# Patient Record
Sex: Male | Born: 1980 | Race: White | Hispanic: No | Smoking: Former smoker
Health system: Southern US, Community
[De-identification: ages and names within clinical notes are randomized; demographics above are authoritative.]

## PROBLEM LIST (undated history)

## (undated) DIAGNOSIS — I639 Cerebral infarction, unspecified: Secondary | ICD-10-CM

## (undated) DIAGNOSIS — M6289 Other specified disorders of muscle: Secondary | ICD-10-CM

## (undated) DIAGNOSIS — Z9289 Personal history of other medical treatment: Secondary | ICD-10-CM

## (undated) DIAGNOSIS — R569 Unspecified convulsions: Secondary | ICD-10-CM

## (undated) HISTORY — DX: Other specified disorders of muscle: M62.89

## (undated) HISTORY — DX: Personal history of other medical treatment: Z92.89

## (undated) HISTORY — PX: HERNIA REPAIR: SHX51

---
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
## 2022-04-23 ENCOUNTER — Emergency Department (HOSPITAL_COMMUNITY): Payer: Medicaid Other

## 2022-04-23 ENCOUNTER — Other Ambulatory Visit: Payer: Self-pay

## 2022-04-23 ENCOUNTER — Emergency Department (HOSPITAL_COMMUNITY)
Admission: EM | Admit: 2022-04-23 | Discharge: 2022-04-23 | Disposition: A | Discharge status: Home / Self Care | Payer: Medicaid Other | Attending: Emergency Medicine | Admitting: Emergency Medicine

## 2022-04-23 ENCOUNTER — Encounter (HOSPITAL_COMMUNITY): Payer: Self-pay | Admitting: *Deleted

## 2022-04-23 DIAGNOSIS — Z982 Presence of cerebrospinal fluid drainage device: Secondary | ICD-10-CM | POA: Diagnosis not present

## 2022-04-23 DIAGNOSIS — G919 Hydrocephalus, unspecified: Secondary | ICD-10-CM | POA: Diagnosis not present

## 2022-04-23 DIAGNOSIS — H538 Other visual disturbances: Secondary | ICD-10-CM | POA: Diagnosis present

## 2022-04-23 HISTORY — DX: Cerebral infarction, unspecified: I63.9

## 2022-04-23 HISTORY — DX: Unspecified convulsions: R56.9

## 2022-04-23 NOTE — Discharge Instructions (Addendum)
I have spoken with the neurosurgery team tonight, they agreed to see you in this coming week.  According to the CT scan of your brain you have a piece of tubing that is not connected to the rest of the tubing that is on the wrong side of your brain, it may very well have been there for quite some time however I would like for you to see Dr. Wynetta Emery this week but I want you to come back to the emergency department if you develop severe or worsening pain, vomiting, fever, changes in your mental status or any other worsening symptoms. ? ?If you would like to see the neurosurgeon that you saw in [place] you are more than welcome to do that however we have excellent neurosurgeons here locally and the team that I spoke with this evening is highly qualified to take care of you locally. ? ?I have printed off the CT scan report, please take this with you to any doctors that you see this week ?

## 2022-04-23 NOTE — ED Provider Notes (Signed)
?[facility] ?Provider Note ? ? ?CSN: [id number] ?Arrival date & time: 04/23/22  2056 ? ?  ? ?History ? ?No chief complaint on file. ? ? ?Christopher Bowman is a 41 y.o. male. ? ?HPI ? 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  He was in his usual state of health doing well until about the last week when he started to feel like the right side of his head was inflamed where the shunt inserts.  He denies any swelling, denies vomiting, denies frank headaches but states that has occasional bit of blurry vision which is very transient, he has not had any significant headaches, no fevers, no stiff neck, no chest pain shortness of breath or abdominal pain.  He has been able to eat a normal amount of food but wanted to get checked out because he felt like something was not quite right. ? ?Home Medications ?Prior to Admission medications   ?Not on File  ?   ? ?Allergies    ?Penicillins   ? ?Review of Systems   ?Review of Systems  ?All other systems reviewed and are negative. ? ?Physical Exam ?Updated Vital Signs ?BP 128/80   Pulse 70   Temp 98.4 ?F (36.9 ?C) (Oral)   Resp 20   Ht 1.778 m (5\' 10" )   Wt 81.6 kg   SpO2 98%   BMI 25.83 kg/m?  ?Physical Exam ?Vitals and nursing note reviewed.  ?Constitutional:   ?   General: He is not in acute distress. ?   Appearance: He is well-developed.  ?HENT:  ?   Head: Normocephalic and atraumatic.  ?   Mouth/Throat:  ?   Pharynx: No oropharyngeal exudate.  ?Eyes:  ?   General: No scleral icterus.    ?   Right eye: No discharge.     ?   Left eye: No discharge.  ?   Conjunctiva/sclera: Conjunctivae normal.  ?   Pupils: Pupils are equal, round, and reactive to light.  ?Neck:  ?   Thyroid:  No thyromegaly.  ?   Vascular: No JVD.  ?   Comments: There is an area to the right lateral neck where the catheter is not palpated, possible discontinuity ?Cardiovascular:  ?   Rate and Rhythm: Normal rate and regular rhythm.  ?   Heart sounds: Normal heart sounds. No murmur heard. ?  No friction rub. No gallop.  ?Pulmonary:  ?   Effort: Pulmonary effort is normal. No respiratory distress.  ?   Breath sounds: Normal breath sounds. No wheezing or rales.  ?Abdominal:  ?   General: Bowel sounds are normal. There is no distension.  ?   Palpations: Abdomen is soft. There is no mass.  ?   Tenderness: There is no abdominal tenderness.  ?Musculoskeletal:     ?   General: No tenderness. Normal range of motion.  ?   Cervical back: Normal range of motion and neck supple.  ?Lymphadenopathy:  ?   Cervical: No cervical adenopathy.  ?Skin: ?   General: Skin is warm and dry.  ?   Findings: No erythema or rash.  ?Neurological:  ?   Mental Status: He is alert.  ?  Coordination: Coordination normal.  ?   Comments: Right arm with some flexion contractures, able to follow commands with all other extremities, cranial nerves III through XII appear otherwise normal except for slight facial droop  ?Psychiatric:     ?   Behavior: Behavior normal.  ? ? ?ED Results / Procedures / Treatments   ?Labs ?(all labs ordered are listed, but only abnormal results are displayed) ?Labs Reviewed - No data to display ? ?EKG ?None ? ?Radiology ?DG Chest 1 View ? ?Result Date: 04/23/2022 ?CLINICAL DATA:  Evaluate ventricular shunt catheter EXAM: PORTABLE CHEST 1 VIEW COMPARISON:  None. FINDINGS: Cardiac shadow is within normal limits. The lungs are clear. No bony abnormality is noted. Right-sided shunt catheter is noted. IMPRESSION: No acute abnormality noted. Electronically Signed   By: Alcide Clever M.D.   On: 04/23/2022 21:42  ? ?DG Cervical Spine 1 View ? ?Result Date: 04/23/2022 ?CLINICAL DATA:  Shunt series EXAM: DG CERVICAL SPINE - 1 VIEW COMPARISON:   By report from 01/18/2019 FINDINGS: No acute bony abnormality is noted. There is discontinuity of the shunt catheter in the right neck although by report from prior chest x-ray in 2020 these changes are stable. The superior most aspect of the catheter is not well visualized. No other focal abnormality is noted. IMPRESSION: Discontinuity of the right-sided ventricular shunt catheter although by history, this is stable from 2020. Electronically Signed   By: Alcide Clever M.D.   On: 04/23/2022 21:41  ? ?DG Abd 1 View ? ?Result Date: 04/23/2022 ?CLINICAL DATA:  Evaluate shunt catheter EXAM: ABDOMEN - 1 VIEW COMPARISON:  None. FINDINGS: Scattered large and small bowel gas is noted. Shunt catheter is noted in the right abdomen. No acute bony abnormality is noted. IMPRESSION: Shunt catheter in the right abdomen.  No acute abnormality is noted. Electronically Signed   By: Alcide Clever M.D.   On: 04/23/2022 21:43  ? ?CT Head Wo Contrast ? ?Result Date: 04/23/2022 ?CLINICAL DATA:  Headache, worsening around the shunt on the right side. EXAM: CT HEAD WITHOUT CONTRAST TECHNIQUE: Contiguous axial images were obtained from the base of the skull through the vertex without intravenous contrast. RADIATION DOSE REDUCTION: This exam was performed according to the departmental dose-optimization program which includes automated exposure control, adjustment of the mA and/or kV according to patient size and/or use of iterative reconstruction technique. COMPARISON:  None. FINDINGS: Brain: Shunt catheter tubing fragment and spring in the posterior left temporal lobe, extending into the left lateral ventricle. Given that the only burr hole, which demonstrates additional catheter fragment is in the right parietal calvarium, this piece tubing has likely migrated from its original location. Additional small possible piece of shunt tubing is noted along the left frontal lobe (series 3, image 52) and along the right anterior falx (series 3, image  51). Enlargement of the left-greater-than-right lateral ventricle, with mild enlargement of the third ventricle and normal fourth ventricle. No definite periventricular hypodensity to suggest transependymal flow of CSF. 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 No acute hemorrhage, mass, or midline shift. No extra-axial collection. Vascular: No hyperdense vessel. Skull: Right parietal burr hole.  No acute fracture or focal lesion. Sinuses/Orbits: Clear paranasal sinuses. No acute finding in the orbits. The mastoids are well aerated. Other: Contiguous with the right parietal burr hole fragment, there is additional shunt hardware in tubing in the soft tissues of the right neck. IMPRESSION: 1. Nonfunctional shunt catheter tubing fragment with spring  in the posterior left temporal lobe, extending into the left lateral ventricle, which appears to have originated from a right parietal burr hole and has migrated significantly from its original location. Additional catheter fragments are noted along the right anterior falx and left frontal convexity. 2. Enlargement of the lateral ventricles, with less significant enlargement of the third ventricle, some of which is attributable to cerebral volume loss in the left frontal lobe from perinatal insult but some of which may atrial to hydrocephalus. No definite hypodensity to suggest transependymal flow of CSF. Correlation with prior imaging would be very helpful, if available. These results were called by telephone at the time of interpretation on 04/23/2022 at 10:57 pm to provider [facility] , who verbally acknowledged these results. Electronically Signed   By: Wiliam Ke M.D.   On: 04/23/2022 23:01   ? ?Procedures ?Procedures  ? ? ?Medications Ordered in ED ?Medications - No data to display ? ?ED Course/ Medical Decision Making/ A&P ?  ?                        ?Medical Decision Making ?Amount and/or Complexity of  Data Reviewed ?Radiology: ordered. ? ? ?I am able to palpate the entire length of the VP shunt from his right scalp down his neck and over the clavicle into the chest wall.  There is no obvious breakage of the

## 2022-04-23 NOTE — ED Notes (Signed)
ED Provider at bedside. 

## 2022-04-23 NOTE — ED Notes (Signed)
Pt returned from CT Scan 

## 2022-04-23 NOTE — ED Triage Notes (Signed)
Pt states he is able to feel irritation from shunt in his head for the past week.  ?

## 2022-08-03 ENCOUNTER — Other Ambulatory Visit: Payer: Self-pay | Admitting: Neurosurgery

## 2022-08-03 ENCOUNTER — Other Ambulatory Visit (HOSPITAL_COMMUNITY): Payer: Self-pay | Admitting: Neurosurgery

## 2022-08-03 DIAGNOSIS — G911 Obstructive hydrocephalus: Secondary | ICD-10-CM

## 2022-08-29 ENCOUNTER — Ambulatory Visit (HOSPITAL_COMMUNITY)
Admission: RE | Admit: 2022-08-29 | Discharge: 2022-08-29 | Disposition: A | Discharge status: Home / Self Care | Payer: Medicaid Other | Source: Ambulatory Visit | Attending: Neurosurgery | Admitting: Neurosurgery

## 2022-08-29 ENCOUNTER — Encounter (HOSPITAL_COMMUNITY): Payer: Self-pay

## 2022-08-29 DIAGNOSIS — G911 Obstructive hydrocephalus: Secondary | ICD-10-CM

## 2022-08-29 NOTE — Progress Notes (Signed)
Patient arrived for outpatient MRI. He has an implanted brain shunt from around 40 years ago. He has no implant card and doesn't know where it was implanted. Spoke with the neuroradiologist, Dr. Mosetta Putt, and he reviewed the CT Head images. He stated the shunt is in pieces and we should not scan him due to safety concerns.

## 2023-04-21 IMAGING — CT CT HEAD W/O CM
4 series · 15 of 47 positions shown, 17 images · non-contrast
Comparison: None.

CLINICAL DATA: Headache, worsening around the shunt on the right
side.



[Series 2: head w o · axial · 0.46mm/px · z∈[+20,+140]mm · 7 of 33 slices shown, 9 images]
[im 5/33  brain]
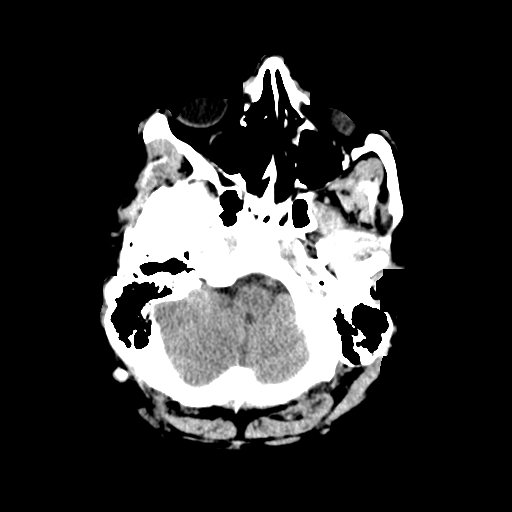
[im 5/33  bone]
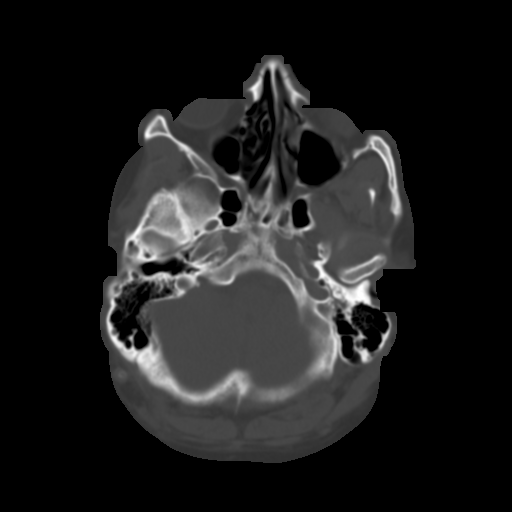
[im 9/33  brain]
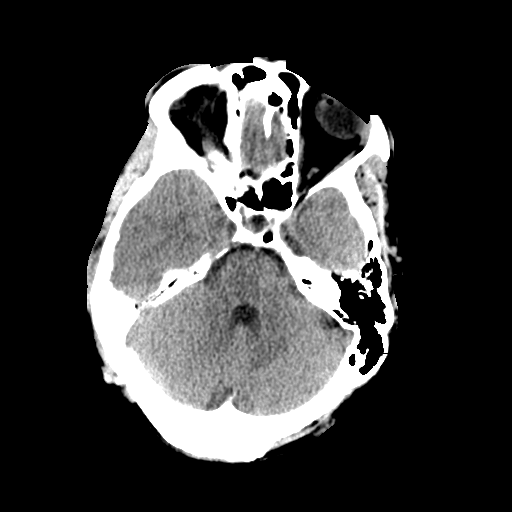
[im 13/33  brain]
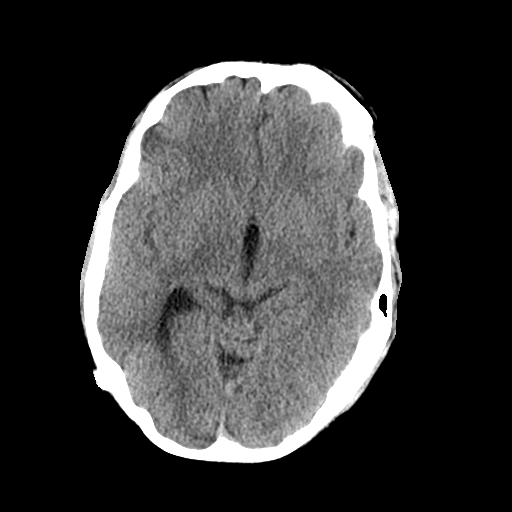
[im 17/33  brain]
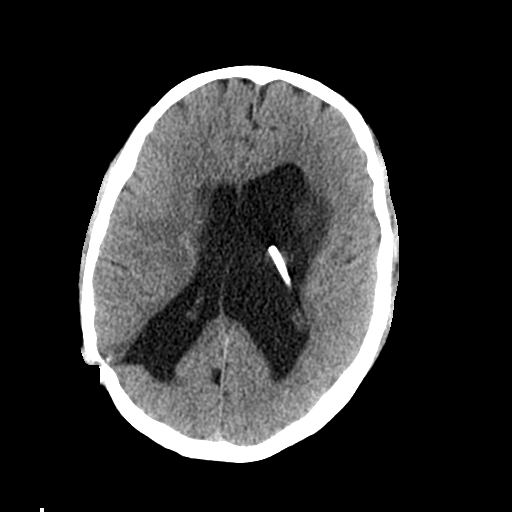
[im 21/33  brain]
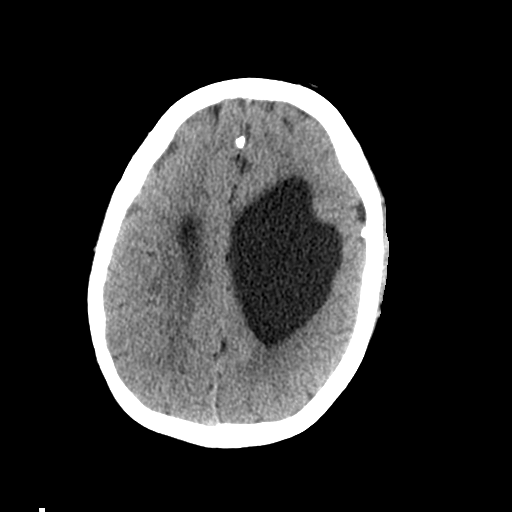
[im 21/33  bone]
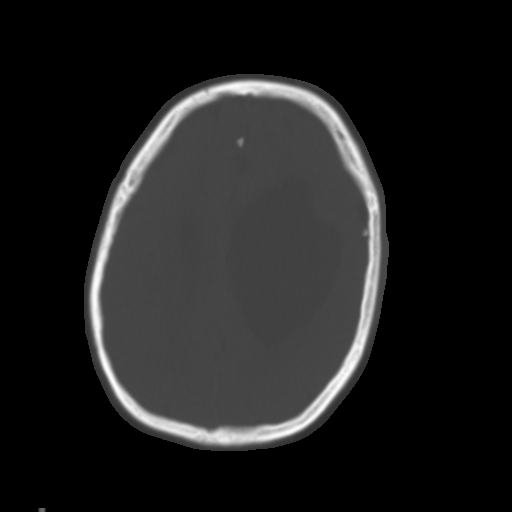
[im 25/33  brain]
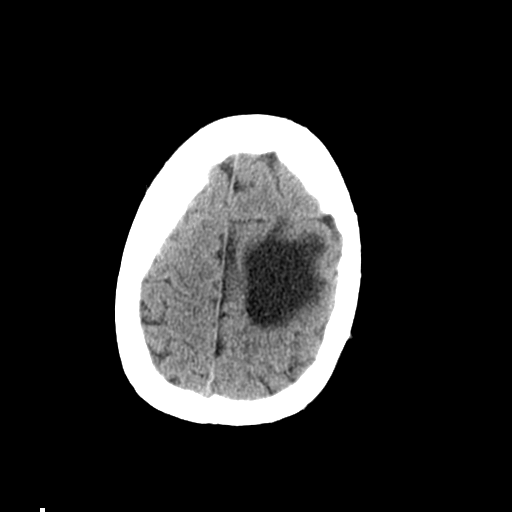
[im 29/33  brain]
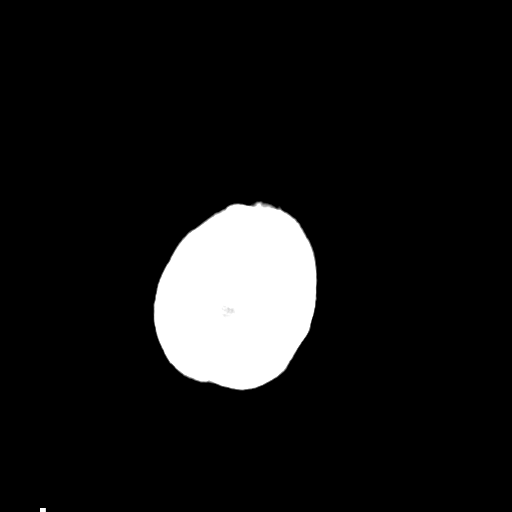

[Series 3: head bone · axial · 0.46mm/px · z∈[+16,+32]mm · 2 of 81 slices shown]
[im 9/81  bone]
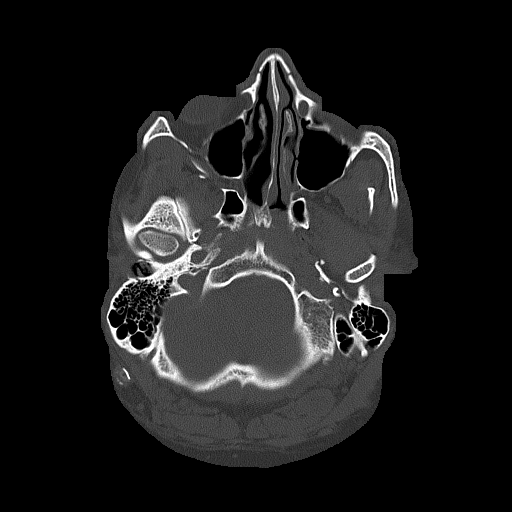
[im 17/81  bone]
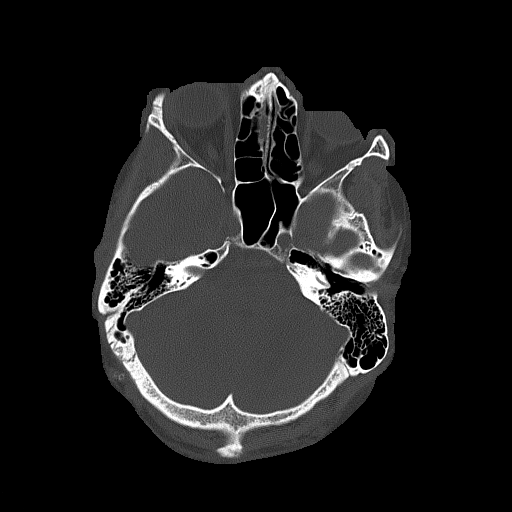

[Series 4: coronal soft · coronal · 0.36mm/px · 3 of 75 slices shown]
[im 25/75  brain]
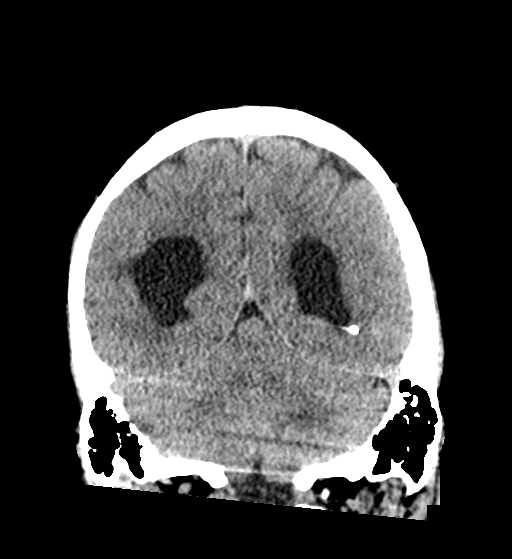
[im 33/75  brain]
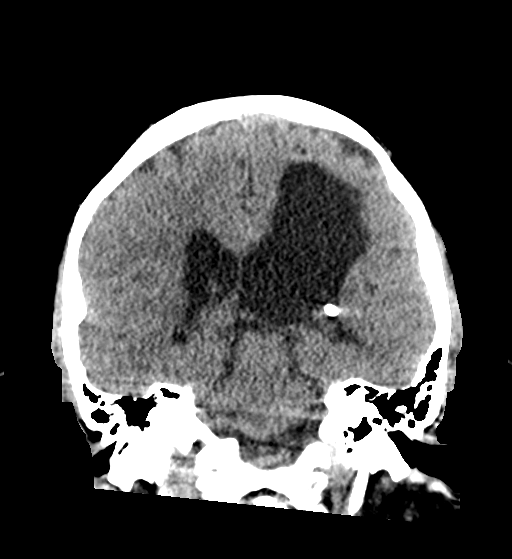
[im 42/75  brain]
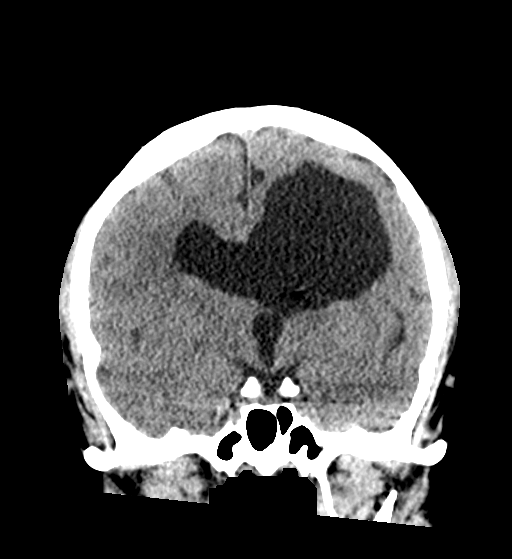

[Series 5: sagittal soft · sagittal · 0.36mm/px · 3 of 65 slices shown]
[im 22/65  brain]
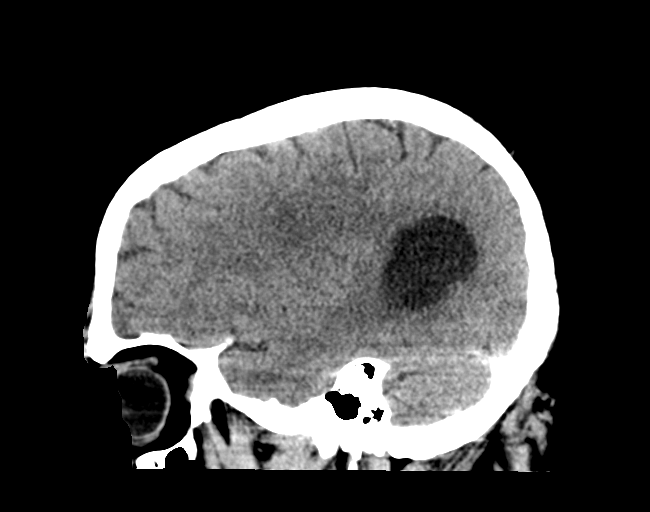
[im 33/65  brain]
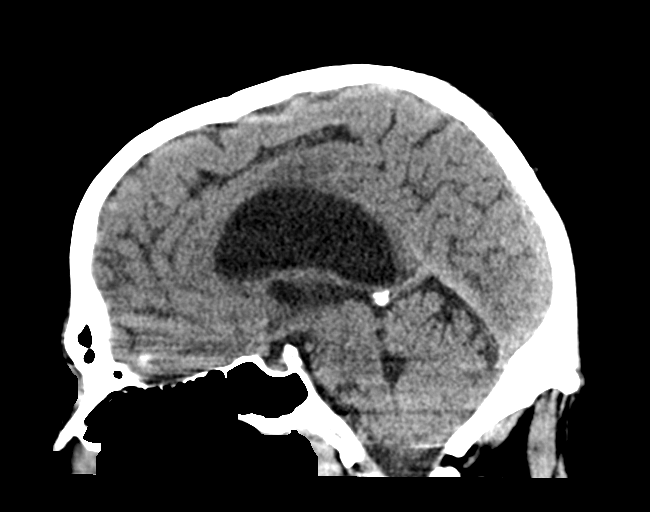
[im 43/65  brain]
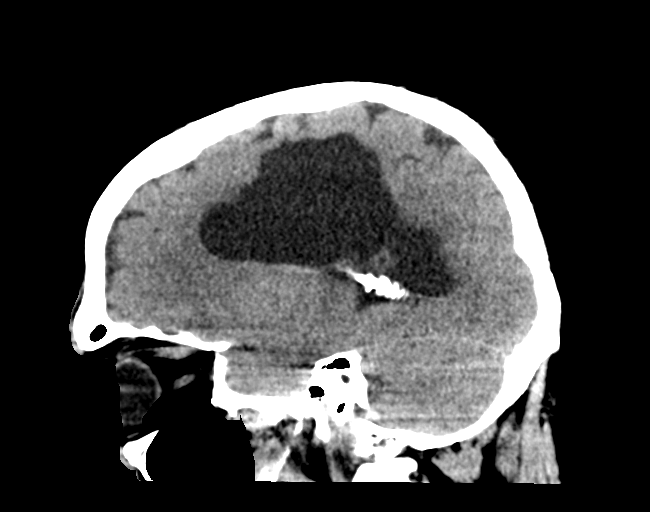

[15 of 47 positions shown; findings below may reference images not displayed]

FINDINGS: Brain: Shunt catheter tubing fragment and De Lara in the posterior
left temporal lobe, extending into the left lateral ventricle. Given
that the only burr hole, which demonstrates additional catheter
fragment is in the right parietal calvarium, this piece tubing has
likely migrated from its original location. Additional small
possible piece of shunt tubing is noted along the left frontal lobe
(series 3, image 52) and along the right anterior falx (series 3,
image 51).

Enlargement of the left-greater-than-right lateral ventricle, with
mild enlargement of the third ventricle and normal fourth ventricle.
No definite periventricular hypodensity to suggest transependymal
flow of CSF. Cerebral volume loss in the left frontal lobe,
compatible with patient's reported perinatal infarct.

No acute hemorrhage, mass, or midline shift. No extra-axial
collection.

Vascular: No hyperdense vessel.

Skull: Right parietal burr hole.  No acute fracture or focal lesion.

Sinuses/Orbits: Clear paranasal sinuses. No acute finding in the
orbits. The mastoids are well aerated.

Other: Contiguous with the right parietal burr hole fragment, there
is additional shunt hardware in tubing in the soft tissues of the
right neck.
IMPRESSION: 1. Nonfunctional shunt catheter tubing fragment with De Lara in the
posterior left temporal lobe, extending into the left lateral
ventricle, which appears to have originated from a right parietal
burr hole and has migrated significantly from its original location.
Additional catheter fragments are noted along the right anterior
falx and left frontal convexity.
2. Enlargement of the lateral ventricles, with less significant
enlargement of the third ventricle, some of which is attributable to
cerebral volume loss in the left frontal lobe from perinatal insult
but some of which may atrial to hydrocephalus. No definite
hypodensity to suggest transependymal flow of CSF. Correlation with
prior imaging would be very helpful, if available.

These results were called by telephone at the time of interpretation
acknowledged these results.

## 2023-05-13 ENCOUNTER — Ambulatory Visit: Payer: Medicaid Other | Admitting: Family Medicine

## 2023-05-13 ENCOUNTER — Encounter: Payer: Self-pay | Admitting: Family Medicine

## 2023-05-13 VITALS — BP 121/80 | HR 71 | Ht 70.0 in | Wt 218.0 lb

## 2023-05-13 DIAGNOSIS — D171 Benign lipomatous neoplasm of skin and subcutaneous tissue of trunk: Secondary | ICD-10-CM

## 2023-05-13 DIAGNOSIS — Z114 Encounter for screening for human immunodeficiency virus [HIV]: Secondary | ICD-10-CM

## 2023-05-13 DIAGNOSIS — I10 Essential (primary) hypertension: Secondary | ICD-10-CM | POA: Diagnosis not present

## 2023-05-13 DIAGNOSIS — Z8709 Personal history of other diseases of the respiratory system: Secondary | ICD-10-CM

## 2023-05-13 DIAGNOSIS — Z1159 Encounter for screening for other viral diseases: Secondary | ICD-10-CM

## 2023-05-13 DIAGNOSIS — E559 Vitamin D deficiency, unspecified: Secondary | ICD-10-CM

## 2023-05-13 DIAGNOSIS — Z131 Encounter for screening for diabetes mellitus: Secondary | ICD-10-CM

## 2023-05-13 DIAGNOSIS — Z13220 Encounter for screening for lipoid disorders: Secondary | ICD-10-CM

## 2023-05-13 DIAGNOSIS — Z9289 Personal history of other medical treatment: Secondary | ICD-10-CM | POA: Diagnosis not present

## 2023-05-13 DIAGNOSIS — Z1329 Encounter for screening for other suspected endocrine disorder: Secondary | ICD-10-CM

## 2023-05-13 DIAGNOSIS — Z23 Encounter for immunization: Secondary | ICD-10-CM | POA: Diagnosis not present

## 2023-05-13 NOTE — Assessment & Plan Note (Signed)
Non painful small fatty lump noted on Left upper quadrant  Discussed lifestyle modifications follow diet low in saturated fat, reduce dietary salt intake, avoid fatty foods, maintain an exercise routine 3 to 5 days a week for a minimum total of 150 minutes.

## 2023-05-13 NOTE — Progress Notes (Signed)
New Patient Office Visit   Subjective   Patient ID: Christopher Bowman, male    DOB: 12-31-1980  Age: 42 y.o. MRN: [id number]  CC:  Chief Complaint  Patient presents with   Establish Care    HPI Christopher Bowman 42 year old male, presents to establish care. He  has a past medical history of Hernia of fascia, History of creation of ventriculoperitoneal shunt, Seizures (HCC), and Stroke (HCC).For the details of today's visit, please refer to assessment and plan.   HPI    Outpatient Encounter Medications as of 05/13/2023  Medication Sig   carbamazepine (TEGRETOL XR) 200 MG 12 hr tablet Take 200 mg by mouth 2 (two) times daily.   No facility-administered encounter medications on file as of 05/13/2023.    Past Surgical History:  Procedure Laterality Date   HERNIA REPAIR     SHUNT REVISION  1984    Review of Systems  Constitutional:  Negative for chills and fever.  Eyes:  Negative for blurred vision.  Respiratory:  Negative for shortness of breath.   Cardiovascular:  Negative for chest pain.  Gastrointestinal:  Negative for abdominal pain and vomiting.  Genitourinary:  Negative for dysuria.  Musculoskeletal:  Negative for myalgias.  Skin:  Negative for rash.  Neurological:  Positive for headaches. Negative for dizziness.      Objective    BP 121/80   Pulse 71   Ht 5\' 10"  (1.778 m)   Wt 218 lb (98.9 kg)   SpO2 98%   BMI 31.28 kg/m   Physical Exam Vitals reviewed.  Constitutional:      General: He is not in acute distress.    Appearance: Normal appearance. He is not ill-appearing, toxic-appearing or diaphoretic.  HENT:     Head: Normocephalic.  Eyes:     General:        Right eye: No discharge.        Left eye: No discharge.     Conjunctiva/sclera: Conjunctivae normal.  Cardiovascular:     Rate and Rhythm: Normal rate.     Pulses: Normal pulses.     Heart sounds: Normal heart sounds.  Pulmonary:     Effort: Pulmonary effort is normal. No respiratory distress.      Breath sounds: Normal breath sounds.  Abdominal:     General: Bowel sounds are normal.     Palpations: Abdomen is soft.     Tenderness: There is no abdominal tenderness. There is no guarding.  Musculoskeletal:     Cervical back: Normal range of motion.  Skin:    General: Skin is warm and dry.     Capillary Refill: Capillary refill takes less than 2 seconds.  Neurological:     General: No focal deficit present.     Mental Status: He is alert and oriented to person, place, and time.     Coordination: Coordination normal.     Gait: Gait normal.  Psychiatric:        Mood and Affect: Mood normal.        Behavior: Behavior normal.       Assessment & Plan:  Vitamin D deficiency -     VITAMIN D 25 Hydroxy (Vit-D Deficiency, Fractures)  Primary hypertension -     CBC with Differential/Platelet -     CMP14+EGFR -     Microalbumin / creatinine urine ratio  Screening for HIV (human immunodeficiency virus) -     HIV Antibody (routine testing w rflx)  Need for hepatitis  C screening test -     Hepatitis C antibody  Screening for thyroid disorder -     TSH + free T4  Screening for diabetes mellitus -     Hemoglobin A1c  Screening for lipid disorders -     Lipid panel  History of nasal obstruction -     Ambulatory referral to ENT  History of creation of ventriculoperitoneal shunt Assessment & Plan: Referral placed to Neurosurgery  Last CT Showed "patient has a separated piece of tubing that is now on the left side of the brain, it is supposed to be in the right side and is no longer connected to the tubing that exits the cranium."  Advise patient if worsening headache or any new alarming symptoms visit ED  Orders: -     Ambulatory referral to Neurosurgery  Abdominal lipoma Assessment & Plan: Non painful small fatty lump noted on Left upper quadrant  Discussed lifestyle modifications follow diet low in saturated fat, reduce dietary salt intake, avoid fatty foods, maintain  an exercise routine 3 to 5 days a week for a minimum total of 150 minutes.     Other orders -     Tdap vaccine greater than or equal to 7yo IM    Return in about 4 months (around 09/13/2023), or if symptoms worsen or fail to improve, for chronic follow-up.   Cruzita Lederer Newman Nip, FNP

## 2023-05-13 NOTE — Assessment & Plan Note (Addendum)
Referral placed to Neurosurgery  Last CT Showed "patient has a separated piece of tubing that is now on the left side of the brain, it is supposed to be in the right side and is no longer connected to the tubing that exits the cranium."  Advise patient if worsening headache or any new alarming symptoms visit ED

## 2023-05-13 NOTE — Patient Instructions (Signed)

## 2023-05-15 LAB — CBC WITH DIFFERENTIAL/PLATELET
Basophils Absolute: 0.1 10*3/uL (ref 0.0–0.2)
Basos: 1 %
EOS (ABSOLUTE): 0.3 10*3/uL (ref 0.0–0.4)
Eos: 4 %
Hematocrit: 49.9 % (ref 37.5–51.0)
Hemoglobin: 16.7 g/dL (ref 13.0–17.7)
Immature Grans (Abs): 0 10*3/uL (ref 0.0–0.1)
Immature Granulocytes: 0 %
Lymphocytes Absolute: 3 10*3/uL (ref 0.7–3.1)
Lymphs: 31 %
MCH: 29.6 pg (ref 26.6–33.0)
MCHC: 33.5 g/dL (ref 31.5–35.7)
MCV: 89 fL (ref 79–97)
Monocytes Absolute: 0.6 10*3/uL (ref 0.1–0.9)
Monocytes: 6 %
Neutrophils Absolute: 5.8 10*3/uL (ref 1.4–7.0)
Neutrophils: 58 %
Platelets: 357 10*3/uL (ref 150–450)
RBC: 5.64 x10E6/uL (ref 4.14–5.80)
RDW: 13.1 % (ref 11.6–15.4)
WBC: 9.8 10*3/uL (ref 3.4–10.8)

## 2023-05-15 LAB — CMP14+EGFR
ALT: 66 IU/L — ABNORMAL HIGH (ref 0–44)
AST: 37 IU/L (ref 0–40)
Albumin/Globulin Ratio: 2.2 (ref 1.2–2.2)
Albumin: 5.2 g/dL — ABNORMAL HIGH (ref 4.1–5.1)
Alkaline Phosphatase: 134 IU/L — ABNORMAL HIGH (ref 44–121)
BUN/Creatinine Ratio: 10 (ref 9–20)
BUN: 13 mg/dL (ref 6–24)
Bilirubin Total: 0.6 mg/dL (ref 0.0–1.2)
CO2: 21 mmol/L (ref 20–29)
Calcium: 10.2 mg/dL (ref 8.7–10.2)
Chloride: 98 mmol/L (ref 96–106)
Creatinine, Ser: 1.29 mg/dL — ABNORMAL HIGH (ref 0.76–1.27)
Globulin, Total: 2.4 g/dL (ref 1.5–4.5)
Glucose: 83 mg/dL (ref 70–99)
Potassium: 4.6 mmol/L (ref 3.5–5.2)
Sodium: 139 mmol/L (ref 134–144)
Total Protein: 7.6 g/dL (ref 6.0–8.5)
eGFR: 71 mL/min/{1.73_m2} (ref >59)

## 2023-05-15 LAB — TSH+FREE T4
Free T4: 1.12 ng/dL (ref 0.82–1.77)
TSH: 3.53 u[IU]/mL (ref 0.450–4.500)

## 2023-05-15 LAB — HEMOGLOBIN A1C
Est. average glucose Bld gHb Est-mCnc: 108 mg/dL
Hgb A1c MFr Bld: 5.4 % (ref 4.8–5.6)

## 2023-05-15 LAB — LIPID PANEL
Chol/HDL Ratio: 6.4 ratio — ABNORMAL HIGH (ref 0.0–5.0)
Cholesterol, Total: 211 mg/dL — ABNORMAL HIGH (ref 100–199)
HDL: 33 mg/dL — ABNORMAL LOW (ref >39)
LDL Chol Calc (NIH): 145 mg/dL — ABNORMAL HIGH (ref 0–99)
Triglycerides: 182 mg/dL — ABNORMAL HIGH (ref 0–149)
VLDL Cholesterol Cal: 33 mg/dL (ref 5–40)

## 2023-05-15 LAB — HIV ANTIBODY (ROUTINE TESTING W REFLEX): HIV Screen 4th Generation wRfx: NONREACTIVE

## 2023-05-15 LAB — MICROALBUMIN / CREATININE URINE RATIO
Creatinine, Urine: 168 mg/dL
Microalb/Creat Ratio: 3 mg/g creat (ref 0–29)
Microalbumin, Urine: 4.7 ug/mL

## 2023-05-15 LAB — VITAMIN D 25 HYDROXY (VIT D DEFICIENCY, FRACTURES): Vit D, 25-Hydroxy: 19.6 ng/mL — ABNORMAL LOW (ref 30.0–100.0)

## 2023-05-15 LAB — HEPATITIS C ANTIBODY: Hep C Virus Ab: NONREACTIVE

## 2023-05-16 NOTE — Progress Notes (Signed)
Please inform patient,  Liver enzymes elevated possible due to signs of increased build up fat in the liver. I encourage weight loss, healthy eating habits- low carb high protein diet, avoid fatty foods, smoking and alcohol intake. Maintain an excercise routine to minimum of 150 minuties a week.     Cholesterol levels elevated, start lifestyle modifications follow diet low in saturated fat, reduce dietary salt intake, avoid fatty foods, maintain an exercise routine 3 to 5 days a week for a minimum total of 150 minutes. I encourage over the counter omega-3 fish oil 1000 mg twice daily.    Vitamin D levels low, I advise to taking  over the counter supplements of vitamin D [phone] IU/day to prevent low vitamin D levels. Consuming Vitamin D rich food sources include fish, salmon, sardines, egg yolks, red meat, liver, oranges, soy milk.

## 2023-06-01 NOTE — Progress Notes (Unsigned)
Referring Physician:  Rica Records, FNP [phone] [address] [place],  [place] [address]  Primary Physician:  Rica Records, FNP  History of Present Illness: 06/02/2023 [name] is here today with a chief complaint of  his shunt not working. A head CT was preformed and showed that the tubing had separated and was not on the correct side.   He had a shunt placed when he was a child which was last revised at age 42.  He comes in today with headaches.  These have been ongoing for some time.  He has known his tubing has been fractured for some time.  Per his report, it has been years.  She reports pain on the left side of his head.  Otherwise he is at his neurologic baseline.    Past Surgery:  Shunt placed when he was a child.    The symptoms are causing a significant impact on the patient's life.   I have utilized the care everywhere function in epic to review the outside records available from external health systems.  Review of Systems:  A 10 point review of systems is negative, except for the pertinent positives and negatives detailed in the HPI.  Past Medical History: Past Medical History:  Diagnosis Date   Hernia of fascia    History of creation of ventriculoperitoneal shunt    Seizures (HCC)    Stroke [facility])     Past Surgical History: Past Surgical History:  Procedure Laterality Date   HERNIA REPAIR     SHUNT REVISION  1984    Allergies: Allergies as of 06/02/2023 - Review Complete 06/02/2023  Allergen Reaction Noted   Penicillins  04/23/2022    Medications:  Current Outpatient Medications:    carbamazepine (TEGRETOL XR) 200 MG 12 hr tablet, Take 200 mg by mouth 2 (two) times daily., Disp: , Rfl:   Social History: Social History   Tobacco Use   Smoking status: Former    Types: Cigarettes   Smokeless tobacco: Never  Substance Use Topics   Alcohol use: Not Currently   Drug use: Yes    Types: Marijuana    Comment: occ.  use    Family Medical History: Family History  Problem Relation Age of Onset   Hepatitis C Mother    Cancer Maternal Uncle    Dementia Paternal Uncle    Cancer Paternal Grandfather     Physical Examination: Vitals:   06/02/23 1428  BP: 118/80    General: Patient is in no apparent distress. Attention to examination is appropriate.  Neck:   Supple.  Full range of motion.  Respiratory: Patient is breathing without any difficulty.   NEUROLOGICAL:     Awake, alert, oriented to person, place, and time.  Speech is clear and fluent.   Cranial Nerves: Pupils equal round and reactive to light.  Facial tone is symmetric.  Facial sensation is symmetric. Shoulder shrug is symmetric. Tongue protrusion is midline.    Strength: Side Biceps Triceps Deltoid Interossei Grip Wrist Ext. Wrist Flex.  R 4 4 4 2 3 4 4   L 5 5 5 5 5 5 5    Side Iliopsoas Quads Hamstring PF DF EHL  R 5 5 5 5 5 5   L 5 5 5 5 5 5    Bilateral upper and lower extremity sensation is intact to light touch.    No evidence of dysmetria noted.  Gait is normal.     Medical  Decision Making  Imaging: CT Head 04/23/2022 IMPRESSION: 1. Nonfunctional shunt catheter tubing fragment with spring in the posterior left temporal lobe, extending into the left lateral ventricle, which appears to have originated from a right parietal burr hole and has migrated significantly from its original location. Additional catheter fragments are noted along the right anterior falx and left frontal convexity. 2. Enlargement of the lateral ventricles, with less significant enlargement of the third ventricle, some of which is attributable to cerebral volume loss in the left frontal lobe from perinatal insult but some of which may atrial to hydrocephalus. No definite hypodensity to suggest transependymal flow of CSF. Correlation with prior imaging would be very helpful, if available.   These results were called by telephone at the time  of interpretation on 04/23/2022 at 10:57 pm to provider [facility] , who verbally acknowledged these results.     Electronically Signed   By: Wiliam Ke M.D.   On: 04/23/2022 23:01   I have personally reviewed the images and agree with the above interpretation.  Assessment and Plan: [name] is a pleasant 42 y.o. 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  He has a fractured intracranial catheter.  His tubing is also fractured in the neck.  I am not certain whether he has been shunt dependent.  I would like to get a low volume lumbar puncture to determine his current intracranial pressure.  If that is normal, it may make sense to observe this.  If it is elevated, I would recommend new shunt placed on the left side.  I did relay to him that I would be unable to remove the catheter currently in his lateral ventricle.  If he would like to consider that, I would refer him to [facility] to one of the surgeons who has access to an endoscope.  I would like to see him back after his lumbar puncture.   Thank you for involving me in the care of this patient.      Jakyria Bleau K. Myer Haff MD, [facility]

## 2023-06-02 ENCOUNTER — Ambulatory Visit: Payer: Medicaid Other | Admitting: Neurosurgery

## 2023-06-02 ENCOUNTER — Encounter: Payer: Self-pay | Admitting: Neurosurgery

## 2023-06-02 VITALS — BP 118/80 | Ht 70.0 in | Wt 221.0 lb

## 2023-06-02 DIAGNOSIS — G919 Hydrocephalus, unspecified: Secondary | ICD-10-CM

## 2023-06-02 DIAGNOSIS — Z9289 Personal history of other medical treatment: Secondary | ICD-10-CM

## 2023-06-08 ENCOUNTER — Telehealth: Payer: Self-pay

## 2023-06-08 NOTE — Telephone Encounter (Signed)
I have sent a message to Paulla Fore in speciality scheduling.   The patient doesn't need to hold any medications for this procedure. Marylu Lund has tried calling him but hasn't been able to reach him, she will continue to try.

## 2023-06-22 ENCOUNTER — Inpatient Hospital Stay
Admission: RE | Admit: 2023-06-22 | Discharge: 2023-06-22 | Disposition: A | Discharge status: Home / Self Care | Payer: Self-pay | Source: Ambulatory Visit | Attending: Neurosurgery | Admitting: Neurosurgery

## 2023-06-22 ENCOUNTER — Other Ambulatory Visit: Payer: Self-pay

## 2023-06-22 DIAGNOSIS — Z049 Encounter for examination and observation for unspecified reason: Secondary | ICD-10-CM

## 2023-09-16 ENCOUNTER — Ambulatory Visit: Payer: Medicaid Other | Admitting: Family Medicine

## 2023-09-29 ENCOUNTER — Ambulatory Visit: Payer: Medicaid Other | Admitting: Family Medicine

## 2023-10-06 ENCOUNTER — Encounter: Payer: Self-pay | Admitting: Family Medicine

## 2023-10-06 ENCOUNTER — Ambulatory Visit: Payer: Medicaid Other | Admitting: Family Medicine

## 2023-10-06 VITALS — BP 138/80 | HR 69 | Ht 70.0 in | Wt 226.0 lb

## 2023-10-06 DIAGNOSIS — Z136 Encounter for screening for cardiovascular disorders: Secondary | ICD-10-CM

## 2023-10-06 DIAGNOSIS — R300 Dysuria: Secondary | ICD-10-CM | POA: Diagnosis not present

## 2023-10-06 DIAGNOSIS — E782 Mixed hyperlipidemia: Secondary | ICD-10-CM | POA: Diagnosis not present

## 2023-10-06 DIAGNOSIS — E785 Hyperlipidemia, unspecified: Secondary | ICD-10-CM | POA: Insufficient documentation

## 2023-10-06 DIAGNOSIS — G911 Obstructive hydrocephalus: Secondary | ICD-10-CM | POA: Insufficient documentation

## 2023-10-06 MED ORDER — ALBUTEROL SULFATE HFA 108 (90 BASE) MCG/ACT IN AERS: 2.0000 | INHALATION_SPRAY | Freq: Four times a day (QID) | RESPIRATORY_TRACT | 2 refills | Status: AC | PRN

## 2023-10-06 NOTE — Progress Notes (Signed)
Patient Office Visit   Subjective   Patient ID: Christopher Bowman, male    DOB: 09-20-1981  Age: 42 y.o. MRN: [id number]  CC:  Chief Complaint  Patient presents with   Follow-up    Patient is here for f/u.     HPI Christopher Bowman 42 year old male, presents to the clinic for follow up visit. He  has a past medical history of Hernia of fascia, History of creation of ventriculoperitoneal shunt, Seizures (HCC), and Stroke (HCC).  The patient reports recurrent episodes of dysuria, which occur intermittently and fluctuate in intensity. The pain is described as aching and burning, with a severity of 5/10. There has been no fever. Associated symptoms include urinary urgency, while the patient denies chills or flank pain. The patient has tried increasing fluid intake, but this has provided mild relief. There is no history of kidney stones.      Outpatient Encounter Medications as of 10/06/2023  Medication Sig   albuterol (VENTOLIN HFA) 108 (90 Base) MCG/ACT inhaler Inhale 2 puffs into the lungs every 6 (six) hours as needed for wheezing or shortness of breath.   aspirin EC 81 MG tablet Take by mouth.   carbamazepine (TEGRETOL XR) 200 MG 12 hr tablet Take 200 mg by mouth 2 (two) times daily. (Patient not taking: Reported on 10/06/2023)   No facility-administered encounter medications on file as of 10/06/2023.    Past Surgical History:  Procedure Laterality Date   HERNIA REPAIR     SHUNT REVISION  1984    Review of Systems  Constitutional:  Negative for chills and fever.  Eyes:  Negative for blurred vision.  Respiratory:  Negative for shortness of breath.   Cardiovascular:  Negative for chest pain.  Gastrointestinal:  Negative for heartburn.  Genitourinary:  Positive for dysuria and urgency. Negative for flank pain, frequency and hematuria.  Skin:  Negative for rash.  Neurological:  Negative for dizziness and headaches.      Objective    BP 138/80   Pulse 69   Ht 5\' 10"  (1.778 m)    Wt 226 lb (102.5 kg)   SpO2 96%   BMI 32.43 kg/m   Physical Exam Vitals reviewed.  Constitutional:      General: He is not in acute distress.    Appearance: Normal appearance. He is not ill-appearing, toxic-appearing or diaphoretic.  HENT:     Head: Normocephalic.  Eyes:     General:        Right eye: No discharge.        Left eye: No discharge.     Conjunctiva/sclera: Conjunctivae normal.  Cardiovascular:     Rate and Rhythm: Normal rate.     Pulses: Normal pulses.     Heart sounds: Normal heart sounds.  Pulmonary:     Effort: Pulmonary effort is normal. No respiratory distress.     Breath sounds: Normal breath sounds.  Abdominal:     General: Bowel sounds are normal.     Palpations: Abdomen is soft.     Tenderness: There is no abdominal tenderness. There is no right CVA tenderness or left CVA tenderness.  Musculoskeletal:     Cervical back: Normal range of motion.  Skin:    General: Skin is warm and dry.     Capillary Refill: Capillary refill takes less than 2 seconds.  Neurological:     Mental Status: He is alert.  Psychiatric:        Mood and Affect: Mood  normal.        Behavior: Behavior normal.       Assessment & Plan:  Encounter for screening for cardiovascular disorders -     Lipid panel -     CMP14+EGFR  Dysuria Assessment & Plan: Urinalysis , urinalysis ordered awaiting results will follow up. Advise patient to increase water intake between 2-3 L a day. Limit intake of caffeine and sugary drinks. Pain management includes: can take OTC tylenol   Orders: -     Urinalysis -     Urine Culture  Mixed hyperlipidemia Assessment & Plan: Lipid panel ordered today Discussed lifestyle modifications follow diet low in saturated fat, reduce dietary salt intake, avoid fatty foods, maintain an exercise routine 3 to 5 days a week for a minimum total of 150 minutes.    Other orders -     Albuterol Sulfate HFA; Inhale 2 puffs into the lungs every 6 (six) hours as  needed for wheezing or shortness of breath.  Dispense: 8 g; Refill: 2    Return in about 1 year (around 10/05/2024), or if symptoms worsen or fail to improve, for hyperlipidemia, routine labs.   Cruzita Lederer Newman Nip, FNP

## 2023-10-06 NOTE — Assessment & Plan Note (Signed)
Lipid panel ordered today Discussed lifestyle modifications follow diet low in saturated fat, reduce dietary salt intake, avoid fatty foods, maintain an exercise routine 3 to 5 days a week for a minimum total of 150 minutes.

## 2023-10-06 NOTE — Assessment & Plan Note (Signed)
Urinalysis , urinalysis ordered awaiting results will follow up. Advise patient to increase water intake between 2-3 L a day. Limit intake of caffeine and sugary drinks. Pain management includes: can take OTC tylenol

## 2023-10-06 NOTE — Patient Instructions (Signed)

## 2023-10-07 LAB — URINALYSIS
Bilirubin, UA: NEGATIVE
Glucose, UA: NEGATIVE
Ketones, UA: NEGATIVE
Leukocytes,UA: NEGATIVE
Nitrite, UA: NEGATIVE
Protein,UA: NEGATIVE
RBC, UA: NEGATIVE
Specific Gravity, UA: 1.02 (ref 1.005–1.030)
Urobilinogen, Ur: 0.2 mg/dL (ref 0.2–1.0)
pH, UA: 7 (ref 5.0–7.5)

## 2023-10-08 LAB — URINE CULTURE: Organism ID, Bacteria: NO GROWTH

## 2023-10-21 LAB — CMP14+EGFR
ALT: 64 [IU]/L — ABNORMAL HIGH (ref 0–44)
AST: 36 [IU]/L (ref 0–40)
Albumin: 4.5 g/dL (ref 4.1–5.1)
Alkaline Phosphatase: 108 [IU]/L (ref 44–121)
BUN/Creatinine Ratio: 11 (ref 9–20)
BUN: 12 mg/dL (ref 6–24)
Bilirubin Total: 0.3 mg/dL (ref 0.0–1.2)
CO2: 21 mmol/L (ref 20–29)
Calcium: 9.2 mg/dL (ref 8.7–10.2)
Chloride: 103 mmol/L (ref 96–106)
Creatinine, Ser: 1.06 mg/dL (ref 0.76–1.27)
Globulin, Total: 2.5 g/dL (ref 1.5–4.5)
Glucose: 99 mg/dL (ref 70–99)
Potassium: 4.6 mmol/L (ref 3.5–5.2)
Sodium: 140 mmol/L (ref 134–144)
Total Protein: 7 g/dL (ref 6.0–8.5)
eGFR: 90 mL/min/{1.73_m2} (ref >59)

## 2023-10-21 LAB — LIPID PANEL
Chol/HDL Ratio: 5.4 {ratio} — ABNORMAL HIGH (ref 0.0–5.0)
Cholesterol, Total: 190 mg/dL (ref 100–199)
HDL: 35 mg/dL — ABNORMAL LOW (ref >39)
LDL Chol Calc (NIH): 137 mg/dL — ABNORMAL HIGH (ref 0–99)
Triglycerides: 97 mg/dL (ref 0–149)
VLDL Cholesterol Cal: 18 mg/dL (ref 5–40)

## 2023-10-21 NOTE — Progress Notes (Signed)
Please inform patient, Cholesterol levels elevated, start lifestyle modifications follow diet low in saturated fat, reduce dietary salt intake, avoid fatty foods, maintain an exercise routine 3 to 5 days a week for a minimum total of 150 minutes.

## 2023-10-24 ENCOUNTER — Telehealth: Payer: Self-pay | Admitting: Family Medicine

## 2023-10-24 NOTE — Telephone Encounter (Signed)
Patient returning call for lab, call back #  [phone]

## 2024-03-30 ENCOUNTER — Ambulatory Visit: Payer: Self-pay

## 2024-03-30 VITALS — BP 131/88 | HR 64 | Ht 70.0 in | Wt 221.1 lb

## 2024-03-30 DIAGNOSIS — R21 Rash and other nonspecific skin eruption: Secondary | ICD-10-CM | POA: Diagnosis not present

## 2024-03-30 MED ORDER — TRIAMCINOLONE ACETONIDE 0.5 % EX OINT: 1.0000 | TOPICAL_OINTMENT | Freq: Two times a day (BID) | CUTANEOUS | 2 refills | Status: AC

## 2024-03-30 NOTE — Assessment & Plan Note (Signed)
 Treat with topical steroid, triamcinolone.  Recommend returning to clinic or contacting through MyChart if no improvement in 2 weeks.  Consider dermatology consult if needed.

## 2024-03-30 NOTE — Progress Notes (Signed)
   Acute Office Visit  Subjective:     Patient ID: Vidur Knust, male    DOB: 1981/09/08, 43 y.o.   MRN: [id number]  Chief Complaint  Patient presents with   Care Management    Pt. Has "bumps on hands and feet" Pts fiance states the bumps on his feet they pop" Pt also states they "Itch bad"    HPI Patient is in today for rash.   Rash: Patient complains of rash involving the left foot and left hand. Rash started 1 month ago. Appearance of rash at onset: Color of lesion(s): pink, Texture of lesion(s): blistering. Rash has not changed over time Initial distribution: left foot and left hand.  Discomfort associated with rash: is pruritic.  Associated symptoms: none. Denies: none. Patient has not had previous evaluation of rash. Patient has not had previous treatment.  Response to treatment:  N/A. Patient has not had contacts with similar rash. Patient has not identified precipitant. Patient has not had new exposures (soaps, lotions, laundry detergents, foods, medications, plants, insects or animals.)    ROS      Objective:    BP 131/88 (BP Location: Left Arm, Patient Position: Sitting, Cuff Size: Normal)   Pulse 64   Ht 5\' 10"  (1.778 m)   Wt 221 lb 1.9 oz (100.3 kg)   SpO2 97%   BMI 31.73 kg/m    Physical Exam Vitals and nursing note reviewed.  Constitutional:      Appearance: Normal appearance.  Skin:    Findings: Rash present. Rash is crusting (sole of left foot) and vesicular (sole of left foot).  Neurological:     Mental Status: He is alert and oriented to person, place, and time.  Psychiatric:        Mood and Affect: Mood normal.     No results found for any visits on 03/30/24.      Assessment & Plan:   Problem List Items Addressed This Visit       Musculoskeletal and Integument   Rash - Primary   Treat with topical steroid, triamcinolone.  Recommend returning to clinic or contacting through MyChart if no improvement in 2 weeks.  Consider dermatology consult if  needed.      Relevant Medications   triamcinolone ointment (KENALOG) 0.5 %    Meds ordered this encounter  Medications   triamcinolone ointment (KENALOG) 0.5 %    Sig: Apply 1 Application topically 2 (two) times daily.    Dispense:  15 g    Refill:  2    No follow-ups on file.  Darral Dash, FNP
